# Patient Record
Sex: Female | Born: 1985 | Race: White | Hispanic: No | Marital: Married | Smoking: Never smoker
Health system: Southern US, Community
[De-identification: ages and names within clinical notes are randomized; demographics above are authoritative.]

---

## 2005-04-29 ENCOUNTER — Other Ambulatory Visit: Admission: RE | Admit: 2005-04-29 | Discharge: 2005-04-29 | Payer: Self-pay | Admitting: Obstetrics and Gynecology

## 2010-03-29 ENCOUNTER — Inpatient Hospital Stay (HOSPITAL_COMMUNITY): Admission: AD | Admit: 2010-03-29 | Payer: Self-pay | Admitting: Obstetrics and Gynecology

## 2010-03-30 ENCOUNTER — Inpatient Hospital Stay (HOSPITAL_COMMUNITY)
Admission: AD | Admit: 2010-03-30 | Discharge: 2010-03-30 | Disposition: A | Discharge status: Home / Self Care | Payer: 59 | Source: Ambulatory Visit | Attending: Obstetrics and Gynecology | Admitting: Obstetrics and Gynecology

## 2010-03-30 DIAGNOSIS — O479 False labor, unspecified: Secondary | ICD-10-CM | POA: Insufficient documentation

## 2010-03-31 ENCOUNTER — Inpatient Hospital Stay (HOSPITAL_COMMUNITY)
Admission: AD | Admit: 2010-03-31 | Discharge: 2010-04-02 | DRG: 775 | Disposition: A | Discharge status: Home / Self Care | Payer: 59 | Source: Ambulatory Visit | Attending: Obstetrics and Gynecology | Admitting: Obstetrics and Gynecology

## 2010-03-31 ENCOUNTER — Inpatient Hospital Stay (HOSPITAL_COMMUNITY)
Admission: AD | Admit: 2010-03-31 | Discharge: 2010-03-31 | Disposition: A | Discharge status: Home / Self Care | Payer: 59 | Source: Ambulatory Visit | Attending: Obstetrics and Gynecology | Admitting: Obstetrics and Gynecology

## 2010-03-31 DIAGNOSIS — O479 False labor, unspecified: Secondary | ICD-10-CM | POA: Insufficient documentation

## 2010-03-31 LAB — CBC
HCT: 36 % (ref 36.0–46.0)
MCV: 92.3 fL (ref 78.0–100.0)
RBC: 3.9 MIL/uL (ref 3.87–5.11)
WBC: 12.4 10*3/uL — ABNORMAL HIGH (ref 4.0–10.5)

## 2010-04-01 LAB — CBC
Hemoglobin: 8.8 g/dL — ABNORMAL LOW (ref 12.0–15.0)
MCH: 30.8 pg (ref 26.0–34.0)
MCV: 92.7 fL (ref 78.0–100.0)
RBC: 2.86 MIL/uL — ABNORMAL LOW (ref 3.87–5.11)
WBC: 12.3 10*3/uL — ABNORMAL HIGH (ref 4.0–10.5)

## 2014-12-01 LAB — OB RESULTS CONSOLE ABO/RH: RH TYPE: POSITIVE

## 2014-12-01 LAB — OB RESULTS CONSOLE HEPATITIS B SURFACE ANTIGEN: Hepatitis B Surface Ag: NEGATIVE

## 2014-12-01 LAB — OB RESULTS CONSOLE RUBELLA ANTIBODY, IGM: RUBELLA: IMMUNE

## 2014-12-01 LAB — OB RESULTS CONSOLE HIV ANTIBODY (ROUTINE TESTING): HIV: NONREACTIVE

## 2015-06-25 ENCOUNTER — Inpatient Hospital Stay (HOSPITAL_COMMUNITY): Payer: No Typology Code available for payment source

## 2015-06-25 ENCOUNTER — Encounter (HOSPITAL_COMMUNITY): Payer: Self-pay

## 2015-06-25 ENCOUNTER — Inpatient Hospital Stay (HOSPITAL_COMMUNITY): Payer: No Typology Code available for payment source | Admitting: Anesthesiology

## 2015-06-25 ENCOUNTER — Inpatient Hospital Stay (HOSPITAL_COMMUNITY)
Admission: AD | Admit: 2015-06-25 | Discharge: 2015-06-28 | DRG: 765 | Disposition: A | Discharge status: Home / Self Care | Payer: No Typology Code available for payment source | Source: Ambulatory Visit | Attending: Obstetrics and Gynecology | Admitting: Obstetrics and Gynecology

## 2015-06-25 ENCOUNTER — Encounter (HOSPITAL_COMMUNITY)
Admission: AD | Disposition: A | Discharge status: Home / Self Care | Payer: Self-pay | Source: Ambulatory Visit | Attending: Obstetrics and Gynecology

## 2015-06-25 DIAGNOSIS — O4693 Antepartum hemorrhage, unspecified, third trimester: Secondary | ICD-10-CM

## 2015-06-25 DIAGNOSIS — Z3A39 39 weeks gestation of pregnancy: Secondary | ICD-10-CM | POA: Diagnosis not present

## 2015-06-25 DIAGNOSIS — O4593 Premature separation of placenta, unspecified, third trimester: Principal | ICD-10-CM | POA: Diagnosis present

## 2015-06-25 DIAGNOSIS — Z98891 History of uterine scar from previous surgery: Secondary | ICD-10-CM

## 2015-06-25 DIAGNOSIS — O9903 Anemia complicating the puerperium: Secondary | ICD-10-CM | POA: Diagnosis not present

## 2015-06-25 DIAGNOSIS — D62 Acute posthemorrhagic anemia: Secondary | ICD-10-CM | POA: Diagnosis not present

## 2015-06-25 LAB — CBC
HCT: 30.4 % — ABNORMAL LOW (ref 36.0–46.0)
HCT: 22.9 % — ABNORMAL LOW (ref 36.0–46.0)
HEMOGLOBIN: 7.7 g/dL — AB (ref 12.0–15.0)
Hemoglobin: 10.1 g/dL — ABNORMAL LOW (ref 12.0–15.0)
MCH: 29.5 pg (ref 26.0–34.0)
MCH: 29.8 pg (ref 26.0–34.0)
MCHC: 33.2 g/dL (ref 30.0–36.0)
MCHC: 33.6 g/dL (ref 30.0–36.0)
MCV: 88.9 fL (ref 78.0–100.0)
MCV: 88.8 fL (ref 78.0–100.0)
PLATELETS: 260 10*3/uL (ref 150–400)
PLATELETS: 208 10*3/uL (ref 150–400)
RBC: 3.42 MIL/uL — AB (ref 3.87–5.11)
RBC: 2.58 MIL/uL — AB (ref 3.87–5.11)
RDW: 13.7 % (ref 11.5–15.5)
RDW: 13.5 % (ref 11.5–15.5)
WBC: 8 10*3/uL (ref 4.0–10.5)
WBC: 12.6 10*3/uL — AB (ref 4.0–10.5)

## 2015-06-25 LAB — PREPARE RBC (CROSSMATCH)

## 2015-06-25 LAB — RPR: RPR: NONREACTIVE

## 2015-06-25 LAB — ABO/RH: ABO/RH(D): A POS

## 2015-06-25 SURGERY — Surgical Case
Anesthesia: Spinal

## 2015-06-25 MED ORDER — NALBUPHINE HCL 10 MG/ML IJ SOLN: 5.0000 mg | Freq: Once | INTRAMUSCULAR | Status: DC | PRN

## 2015-06-25 MED ORDER — DIPHENHYDRAMINE HCL 50 MG/ML IJ SOLN: 12.5000 mg | INTRAMUSCULAR | Status: DC | PRN

## 2015-06-25 MED ORDER — SODIUM CHLORIDE 0.9% FLUSH: 3.0000 mL | INTRAVENOUS | Status: DC | PRN

## 2015-06-25 MED ORDER — PHENYLEPHRINE 8 MG IN D5W 100 ML (0.08MG/ML) PREMIX OPTIME
INJECTION | INTRAVENOUS | Status: DC | PRN
  Administered 2015-06-25: 80 ug/min via INTRAVENOUS

## 2015-06-25 MED ORDER — OXYTOCIN 10 UNIT/ML IJ SOLN
INTRAMUSCULAR | Status: AC
  Filled 2015-06-25: qty 4

## 2015-06-25 MED ORDER — FENTANYL CITRATE (PF) 100 MCG/2ML IJ SOLN: 25.0000 ug | INTRAMUSCULAR | Status: DC | PRN

## 2015-06-25 MED ORDER — ONDANSETRON HCL 4 MG/2ML IJ SOLN
INTRAMUSCULAR | Status: AC
  Filled 2015-06-25: qty 2

## 2015-06-25 MED ORDER — ZOLPIDEM TARTRATE 5 MG PO TABS: 5.0000 mg | ORAL_TABLET | Freq: Every evening | ORAL | Status: DC | PRN

## 2015-06-25 MED ORDER — CITRIC ACID-SODIUM CITRATE 334-500 MG/5ML PO SOLN
30.0000 mL | Freq: Once | ORAL | Status: AC
  Administered 2015-06-25: 30 mL via ORAL
  Filled 2015-06-25: qty 15

## 2015-06-25 MED ORDER — ACETAMINOPHEN 325 MG PO TABS
650.0000 mg | ORAL_TABLET | ORAL | Status: DC | PRN
  Administered 2015-06-26 – 2015-06-27 (×4): 650 mg via ORAL
  Filled 2015-06-25 (×5): qty 2

## 2015-06-25 MED ORDER — ONDANSETRON HCL 4 MG/2ML IJ SOLN: 4.0000 mg | Freq: Three times a day (TID) | INTRAMUSCULAR | Status: DC | PRN

## 2015-06-25 MED ORDER — FENTANYL CITRATE (PF) 100 MCG/2ML IJ SOLN
INTRAMUSCULAR | Status: DC | PRN
  Administered 2015-06-25: 12.5 ug via INTRATHECAL

## 2015-06-25 MED ORDER — MORPHINE SULFATE (PF) 0.5 MG/ML IJ SOLN
INTRAMUSCULAR | Status: DC | PRN
  Administered 2015-06-25: .1 mg via INTRATHECAL

## 2015-06-25 MED ORDER — COCONUT OIL OIL
1.0000 "application " | TOPICAL_OIL | Status: DC | PRN
  Administered 2015-06-27: 1 via TOPICAL
  Filled 2015-06-25: qty 120

## 2015-06-25 MED ORDER — NALBUPHINE HCL 10 MG/ML IJ SOLN: 5.0000 mg | INTRAMUSCULAR | Status: DC | PRN

## 2015-06-25 MED ORDER — LACTATED RINGERS IV SOLN
INTRAVENOUS | Status: DC
  Administered 2015-06-25 – 2015-06-26 (×3): via INTRAVENOUS

## 2015-06-25 MED ORDER — MEPERIDINE HCL 25 MG/ML IJ SOLN
INTRAMUSCULAR | Status: DC | PRN
  Administered 2015-06-25 (×2): 12.5 mg via INTRAVENOUS

## 2015-06-25 MED ORDER — FENTANYL CITRATE (PF) 100 MCG/2ML IJ SOLN
INTRAMUSCULAR | Status: AC
  Filled 2015-06-25: qty 2

## 2015-06-25 MED ORDER — SCOPOLAMINE 1 MG/3DAYS TD PT72
MEDICATED_PATCH | TRANSDERMAL | Status: AC
  Filled 2015-06-25: qty 1

## 2015-06-25 MED ORDER — WITCH HAZEL-GLYCERIN EX PADS: 1.0000 "application " | MEDICATED_PAD | CUTANEOUS | Status: DC | PRN

## 2015-06-25 MED ORDER — FAMOTIDINE IN NACL 20-0.9 MG/50ML-% IV SOLN
20.0000 mg | Freq: Once | INTRAVENOUS | Status: AC
  Administered 2015-06-25: 20 mg via INTRAVENOUS
  Filled 2015-06-25: qty 50

## 2015-06-25 MED ORDER — SENNOSIDES-DOCUSATE SODIUM 8.6-50 MG PO TABS
2.0000 | ORAL_TABLET | ORAL | Status: DC
  Administered 2015-06-26 – 2015-06-28 (×3): 2 via ORAL
  Filled 2015-06-25 (×3): qty 2

## 2015-06-25 MED ORDER — TETANUS-DIPHTH-ACELL PERTUSSIS 5-2.5-18.5 LF-MCG/0.5 IM SUSP: 0.5000 mL | Freq: Once | INTRAMUSCULAR | Status: DC

## 2015-06-25 MED ORDER — METOCLOPRAMIDE HCL 5 MG/ML IJ SOLN: 10.0000 mg | Freq: Once | INTRAMUSCULAR | Status: DC | PRN

## 2015-06-25 MED ORDER — DEXAMETHASONE SODIUM PHOSPHATE 4 MG/ML IJ SOLN
INTRAMUSCULAR | Status: AC
  Filled 2015-06-25: qty 1

## 2015-06-25 MED ORDER — PHENYLEPHRINE 40 MCG/ML (10ML) SYRINGE FOR IV PUSH (FOR BLOOD PRESSURE SUPPORT)
PREFILLED_SYRINGE | INTRAVENOUS | Status: AC
  Filled 2015-06-25: qty 10

## 2015-06-25 MED ORDER — SODIUM CHLORIDE 0.9 % IV SOLN
Freq: Once | INTRAVENOUS | Status: DC
Start: 2015-06-25 — End: 2015-06-25

## 2015-06-25 MED ORDER — KETOROLAC TROMETHAMINE 30 MG/ML IJ SOLN
INTRAMUSCULAR | Status: AC
  Filled 2015-06-25: qty 1

## 2015-06-25 MED ORDER — SODIUM CHLORIDE 0.9 % IV SOLN
10000.0000 ug | INTRAVENOUS | Status: DC | PRN
  Administered 2015-06-25 (×2): 80 ug via INTRAVENOUS

## 2015-06-25 MED ORDER — LACTATED RINGERS IV SOLN
INTRAVENOUS | Status: DC | PRN
  Administered 2015-06-25: 06:00:00 via INTRAVENOUS

## 2015-06-25 MED ORDER — OXYTOCIN 10 UNIT/ML IJ SOLN
40.0000 [IU] | INTRAVENOUS | Status: DC | PRN
  Administered 2015-06-25: 40 [IU] via INTRAVENOUS

## 2015-06-25 MED ORDER — SIMETHICONE 80 MG PO CHEW: 80.0000 mg | CHEWABLE_TABLET | ORAL | Status: DC | PRN

## 2015-06-25 MED ORDER — OXYCODONE HCL 5 MG PO TABS
10.0000 mg | ORAL_TABLET | ORAL | Status: DC | PRN
  Administered 2015-06-26 (×2): 10 mg via ORAL
  Filled 2015-06-25 (×2): qty 2

## 2015-06-25 MED ORDER — MEPERIDINE HCL 25 MG/ML IJ SOLN
INTRAMUSCULAR | Status: AC
  Filled 2015-06-25: qty 1

## 2015-06-25 MED ORDER — NALOXONE HCL 0.4 MG/ML IJ SOLN: 0.4000 mg | INTRAMUSCULAR | Status: DC | PRN

## 2015-06-25 MED ORDER — MENTHOL 3 MG MT LOZG: 1.0000 | LOZENGE | OROMUCOSAL | Status: DC | PRN

## 2015-06-25 MED ORDER — SCOPOLAMINE 1 MG/3DAYS TD PT72
1.0000 | MEDICATED_PATCH | Freq: Once | TRANSDERMAL | Status: DC
  Filled 2015-06-25: qty 1

## 2015-06-25 MED ORDER — OXYTOCIN 40 UNITS IN LACTATED RINGERS INFUSION - SIMPLE MED: 2.5000 [IU]/h | INTRAVENOUS | Status: AC

## 2015-06-25 MED ORDER — SCOPOLAMINE 1 MG/3DAYS TD PT72
MEDICATED_PATCH | TRANSDERMAL | Status: DC | PRN
  Administered 2015-06-25: 1 via TRANSDERMAL

## 2015-06-25 MED ORDER — SIMETHICONE 80 MG PO CHEW
80.0000 mg | CHEWABLE_TABLET | ORAL | Status: DC
  Administered 2015-06-26 – 2015-06-28 (×3): 80 mg via ORAL
  Filled 2015-06-25 (×3): qty 1

## 2015-06-25 MED ORDER — EPHEDRINE SULFATE 50 MG/ML IJ SOLN
INTRAMUSCULAR | Status: DC | PRN
  Administered 2015-06-25: 5 mg via INTRAVENOUS

## 2015-06-25 MED ORDER — MEPERIDINE HCL 25 MG/ML IJ SOLN: 6.2500 mg | INTRAMUSCULAR | Status: DC | PRN

## 2015-06-25 MED ORDER — PHENYLEPHRINE 8 MG IN D5W 100 ML (0.08MG/ML) PREMIX OPTIME
INJECTION | INTRAVENOUS | Status: AC
  Filled 2015-06-25: qty 100

## 2015-06-25 MED ORDER — IBUPROFEN 600 MG PO TABS
600.0000 mg | ORAL_TABLET | Freq: Four times a day (QID) | ORAL | Status: DC
  Administered 2015-06-26 – 2015-06-28 (×11): 600 mg via ORAL
  Filled 2015-06-25 (×11): qty 1

## 2015-06-25 MED ORDER — KETOROLAC TROMETHAMINE 30 MG/ML IJ SOLN
30.0000 mg | Freq: Four times a day (QID) | INTRAMUSCULAR | Status: AC | PRN
  Administered 2015-06-25: 30 mg via INTRAMUSCULAR

## 2015-06-25 MED ORDER — DEXAMETHASONE SODIUM PHOSPHATE 4 MG/ML IJ SOLN
INTRAMUSCULAR | Status: DC | PRN
  Administered 2015-06-25: 4 mg via INTRAVENOUS

## 2015-06-25 MED ORDER — LACTATED RINGERS IV SOLN: INTRAVENOUS | Status: DC

## 2015-06-25 MED ORDER — ONDANSETRON HCL 4 MG/2ML IJ SOLN
INTRAMUSCULAR | Status: DC | PRN
  Administered 2015-06-25: 4 mg via INTRAVENOUS

## 2015-06-25 MED ORDER — CEFAZOLIN SODIUM-DEXTROSE 2-4 GM/100ML-% IV SOLN
2.0000 g | INTRAVENOUS | Status: AC
  Administered 2015-06-25: 2 g via INTRAVENOUS
  Filled 2015-06-25: qty 100

## 2015-06-25 MED ORDER — DIPHENHYDRAMINE HCL 25 MG PO CAPS
25.0000 mg | ORAL_CAPSULE | ORAL | Status: DC | PRN
  Administered 2015-06-25: 25 mg via ORAL
  Filled 2015-06-25: qty 1

## 2015-06-25 MED ORDER — SIMETHICONE 80 MG PO CHEW
80.0000 mg | CHEWABLE_TABLET | Freq: Three times a day (TID) | ORAL | Status: DC
  Administered 2015-06-25 – 2015-06-28 (×10): 80 mg via ORAL
  Filled 2015-06-25 (×10): qty 1

## 2015-06-25 MED ORDER — NALOXONE HCL 2 MG/2ML IJ SOSY: 1.0000 ug/kg/h | PREFILLED_SYRINGE | INTRAVENOUS | Status: DC | PRN

## 2015-06-25 MED ORDER — BUPIVACAINE IN DEXTROSE 0.75-8.25 % IT SOLN
INTRATHECAL | Status: DC | PRN
  Administered 2015-06-25: 1.4 mL via INTRATHECAL

## 2015-06-25 MED ORDER — OXYCODONE HCL 5 MG PO TABS
5.0000 mg | ORAL_TABLET | ORAL | Status: DC | PRN
  Administered 2015-06-27 – 2015-06-28 (×2): 5 mg via ORAL
  Filled 2015-06-25 (×2): qty 1

## 2015-06-25 MED ORDER — SODIUM CHLORIDE 0.9 % IV SOLN
INTRAVENOUS | Status: DC
  Administered 2015-06-25: 05:00:00 via INTRAVENOUS

## 2015-06-25 MED ORDER — KETOROLAC TROMETHAMINE 30 MG/ML IJ SOLN
30.0000 mg | Freq: Four times a day (QID) | INTRAMUSCULAR | Status: AC | PRN
  Administered 2015-06-25: 30 mg via INTRAVENOUS
  Filled 2015-06-25: qty 1

## 2015-06-25 MED ORDER — DIBUCAINE 1 % RE OINT: 1.0000 "application " | TOPICAL_OINTMENT | RECTAL | Status: DC | PRN

## 2015-06-25 MED ORDER — LACTATED RINGERS IV SOLN
INTRAVENOUS | Status: DC | PRN
  Administered 2015-06-25 (×2): via INTRAVENOUS

## 2015-06-25 MED ORDER — MORPHINE SULFATE (PF) 0.5 MG/ML IJ SOLN
INTRAMUSCULAR | Status: AC
  Filled 2015-06-25: qty 10

## 2015-06-25 MED ORDER — DIPHENHYDRAMINE HCL 25 MG PO CAPS: 25.0000 mg | ORAL_CAPSULE | Freq: Four times a day (QID) | ORAL | Status: DC | PRN

## 2015-06-25 MED ORDER — PRENATAL MULTIVITAMIN CH
1.0000 | ORAL_TABLET | Freq: Every day | ORAL | Status: DC
  Administered 2015-06-26 – 2015-06-28 (×3): 1 via ORAL
  Filled 2015-06-25 (×3): qty 1

## 2015-06-25 SURGICAL SUPPLY — 36 items
APL SKNCLS STERI-STRIP NONHPOA (GAUZE/BANDAGES/DRESSINGS)
BENZOIN TINCTURE PRP APPL 2/3 (GAUZE/BANDAGES/DRESSINGS) IMPLANT
CLAMP CORD UMBIL (MISCELLANEOUS) IMPLANT
CLOSURE WOUND 1/2 X4 (GAUZE/BANDAGES/DRESSINGS)
CLOTH BEACON ORANGE TIMEOUT ST (SAFETY) ×3 IMPLANT
CONTAINER PREFILL 10% NBF 15ML (MISCELLANEOUS) IMPLANT
DRSG OPSITE POSTOP 4X10 (GAUZE/BANDAGES/DRESSINGS) ×3 IMPLANT
DURAPREP 26ML APPLICATOR (WOUND CARE) ×3 IMPLANT
ELECT REM PT RETURN 9FT ADLT (ELECTROSURGICAL) ×3
ELECTRODE REM PT RTRN 9FT ADLT (ELECTROSURGICAL) ×1 IMPLANT
EXTRACTOR VACUUM M CUP 4 TUBE (SUCTIONS) IMPLANT
EXTRACTOR VACUUM M CUP 4' TUBE (SUCTIONS)
GLOVE BIO SURGEON STRL SZ8 (GLOVE) ×3 IMPLANT
GLOVE BIOGEL PI IND STRL 7.0 (GLOVE) ×1 IMPLANT
GLOVE BIOGEL PI INDICATOR 7.0 (GLOVE) ×2
GOWN STRL REUS W/TWL LRG LVL3 (GOWN DISPOSABLE) ×6 IMPLANT
HEMOSTAT SURGICEL 2X3 (HEMOSTASIS) ×2 IMPLANT
KIT ABG SYR 3ML LUER SLIP (SYRINGE) ×3 IMPLANT
LIQUID BAND (GAUZE/BANDAGES/DRESSINGS) IMPLANT
NDL HYPO 25X5/8 SAFETYGLIDE (NEEDLE) ×1 IMPLANT
NEEDLE HYPO 25X5/8 SAFETYGLIDE (NEEDLE) ×3 IMPLANT
NS IRRIG 1000ML POUR BTL (IV SOLUTION) ×3 IMPLANT
PACK C SECTION WH (CUSTOM PROCEDURE TRAY) ×3 IMPLANT
PAD OB MATERNITY 4.3X12.25 (PERSONAL CARE ITEMS) ×3 IMPLANT
PENCIL SMOKE EVAC W/HOLSTER (ELECTROSURGICAL) ×3 IMPLANT
STRIP CLOSURE SKIN 1/2X4 (GAUZE/BANDAGES/DRESSINGS) IMPLANT
SUT MNCRL 0 VIOLET CTX 36 (SUTURE) ×4 IMPLANT
SUT MONOCRYL 0 CTX 36 (SUTURE) ×8
SUT PDS AB 0 CTX 60 (SUTURE) ×3 IMPLANT
SUT PLAIN 0 NONE (SUTURE) ×4 IMPLANT
SUT PLAIN 2 0 (SUTURE)
SUT PLAIN 2 0 XLH (SUTURE) IMPLANT
SUT PLAIN ABS 2-0 CT1 27XMFL (SUTURE) IMPLANT
SUT VIC AB 4-0 KS 27 (SUTURE) ×3 IMPLANT
TOWEL OR 17X24 6PK STRL BLUE (TOWEL DISPOSABLE) ×3 IMPLANT
TRAY FOLEY CATH SILVER 14FR (SET/KITS/TRAYS/PACK) ×3 IMPLANT

## 2015-06-25 NOTE — Anesthesia Preprocedure Evaluation (Signed)
Anesthesia Evaluation  Patient identified by MRN, date of birth, ID band Patient awake    Reviewed: Allergy & Precautions, NPO status , Patient's Chart, lab work & pertinent test results  Airway Mallampati: II  TM Distance: >3 FB Neck ROM: Full    Dental no notable dental hx.    Pulmonary neg pulmonary ROS,    Pulmonary exam normal breath sounds clear to auscultation       Cardiovascular negative cardio ROS Normal cardiovascular exam Rhythm:Regular Rate:Normal     Neuro/Psych negative neurological ROS  negative psych ROS   GI/Hepatic negative GI ROS, Neg liver ROS,   Endo/Other  negative endocrine ROS  Renal/GU negative Renal ROS  negative genitourinary   Musculoskeletal negative musculoskeletal ROS (+)   Abdominal   Peds negative pediatric ROS (+)  Hematology negative hematology ROS (+)   Anesthesia Other Findings   Reproductive/Obstetrics (+) Pregnancy Suspected abruption                             Anesthesia Physical Anesthesia Plan  ASA: II  Anesthesia Plan: Spinal   Post-op Pain Management:    Induction:   Airway Management Planned: Natural Airway  Additional Equipment:   Intra-op Plan:   Post-operative Plan:   Informed Consent: I have reviewed the patients History and Physical, chart, labs and discussed the procedure including the risks, benefits and alternatives for the proposed anesthesia with the patient or authorized representative who has indicated his/her understanding and acceptance.   Dental advisory given  Plan Discussed with: CRNA  Anesthesia Plan Comments:         Anesthesia Quick Evaluation

## 2015-06-25 NOTE — Anesthesia Procedure Notes (Signed)
Spinal Patient location during procedure: OR Staffing Anesthesiologist: Orlo Brickle Performed by: anesthesiologist  Preanesthetic Checklist Completed: patient identified, site marked, surgical consent, pre-op evaluation, timeout performed, IV checked, risks and benefits discussed and monitors and equipment checked Spinal Block Patient position: sitting Prep: ChloraPrep Patient monitoring: heart rate, continuous pulse ox and blood pressure Approach: midline Location: L3-4 Injection technique: single-shot Needle Needle type: Sprotte  Needle gauge: 24 G Needle length: 9 cm Additional Notes Expiration date of kit checked and confirmed. Patient tolerated procedure well, without complications.     

## 2015-06-25 NOTE — Anesthesia Postprocedure Evaluation (Signed)
Anesthesia Post Note  Patient: Ashley SinnerLeslie G Wolf  Procedure(s) Performed: Procedure(s) (LRB): CESAREAN SECTION (N/A)  Patient location during evaluation: PACU Anesthesia Type: Spinal Level of consciousness: oriented and awake and alert Pain management: pain level controlled Vital Signs Assessment: post-procedure vital signs reviewed and stable Respiratory status: spontaneous breathing, respiratory function stable and patient connected to nasal cannula oxygen Cardiovascular status: blood pressure returned to baseline and stable Postop Assessment: no headache, no backache, spinal receding and patient able to bend at knees Anesthetic complications: no     Last Vitals:  Filed Vitals:   06/25/15 0730 06/25/15 0745  BP: 108/78 113/77  Pulse: 71 80  Temp: 36.3 C 36.4 C  Resp: 16 16    Last Pain:  Filed Vitals:   06/25/15 0750  PainSc: 3    Pain Goal:    LLE Motor Response: Purposeful movement (06/25/15 0745) LLE Sensation: Decreased (06/25/15 0745) RLE Motor Response: Purposeful movement (06/25/15 0745) RLE Sensation: Decreased (06/25/15 0745)      Myriam Brandhorst J

## 2015-06-25 NOTE — Progress Notes (Signed)
Bair Hugger applied

## 2015-06-25 NOTE — Brief Op Note (Signed)
06/25/2015  6:55 AM  PATIENT:  Martina SinnerLeslie G Lerette  30 y.o. female  PRE-OPERATIVE DIAGNOSIS:  cesarean section vaginal bleeding, probable placental abpruption  POST-OPERATIVE DIAGNOSIS:  cesarean section, vaginal bleeding, placental abpurtion  PROCEDURE:  Procedure(s): CESAREAN SECTION (N/A)  SURGEON:  Surgeon(s) and Role:    * Harold HedgeJames Aryeh Butterfield, MD - Primary  PHYSICIAN ASSISTANT:   ASSISTANTS: none   ANESTHESIA:   spinal  EBL:  Total I/O In: 2500 [I.V.:2500] Out: 1200 [Urine:400; Blood:800]  BLOOD ADMINISTERED:none  DRAINS: Urinary Catheter (Foley)   LOCAL MEDICATIONS USED:  NONE  SPECIMEN:  Source of Specimen:  placenta  DISPOSITION OF SPECIMEN:  PATHOLOGY  COUNTS:  YES  TOURNIQUET:  * No tourniquets in log *  DICTATION: .Other Dictation: Dictation Number [id number]  PLAN OF CARE: Admit to inpatient   PATIENT DISPOSITION:  PACU - hemodynamically stable.   Delay start of Pharmacological VTE agent (>24hrs) due to surgical blood loss or risk of bleeding: not applicable

## 2015-06-25 NOTE — Transfer of Care (Signed)
Immediate Anesthesia Transfer of Care Note  Patient: Ashley Wolf  Procedure(s) Performed: Procedure(s): CESAREAN SECTION (N/A)  Patient Location: PACU  Anesthesia Type:Spinal  Level of Consciousness: awake, alert , oriented and patient cooperative  Airway & Oxygen Therapy: Patient Spontanous Breathing  Post-op Assessment: Report given to RN and Post -op Vital signs reviewed and stable  Post vital signs: Reviewed and stable  Last Vitals: There were no vitals filed for this visit.  Last Pain:  Filed Vitals:   06/25/15 0629  PainSc: 4          Complications: No apparent anesthesia complications

## 2015-06-25 NOTE — H&P (Signed)
Ashley SinnerLeslie G Wolf is a 30 y.o. female presenting for Vaginal bleeding since 0330 . Maternal Medical History:  Reason for admission: Vaginal bleeding.  Nausea. Started bleeding 0330  Contractions: Onset was 1-2 hours ago.   Frequency: regular.   Perceived severity is moderate.    Fetal activity: Perceived fetal activity is normal.   Last perceived fetal movement was within the past hour.    Prenatal complications: Bleeding.   No HIV, PIH, oligohydramnios, placental abnormality, polyhydramnios, pre-eclampsia or preterm labor.   Prenatal Complications - Diabetes: none.    OB History    Gravida Para Term Preterm AB TAB SAB Ectopic Multiple Living   2 1 1       1      History reviewed. No pertinent past medical history. History reviewed. No pertinent past surgical history. Family History: family history is not on file. Social History:  reports that she has never smoked. She does not have any smokeless tobacco history on file. Her alcohol and drug histories are not on file.   Prenatal Transfer Tool  Maternal Diabetes: No Genetic Screening: Normal Maternal Ultrasounds/Referrals: Normal Fetal Ultrasounds or other Referrals:  None Maternal Substance Abuse:  No Significant Maternal Medications:  None Significant Maternal Lab Results:  None Other Comments:  None  Review of Systems  Constitutional: Negative for fever, chills and malaise/fatigue.  Respiratory: Negative for shortness of breath.   Gastrointestinal: Positive for abdominal pain. Negative for nausea, diarrhea and constipation.  Genitourinary: Negative for dysuria.       Vaginal bleeding  Musculoskeletal: Negative for myalgias.  Neurological: Positive for dizziness. Negative for headaches.    Dilation: 2.5 Effacement (%): 80 Station: -2 Exam by:: [name] CNM There were no vitals taken for this visit. Maternal Exam:  Uterine Assessment: Contraction strength is moderate.  Contraction frequency is regular.    Abdomen: Patient reports no abdominal tenderness. Fundal height is 39.   Estimated fetal weight is 7.   Fetal presentation: vertex  Introitus: Normal vulva. Vagina is positive for vaginal discharge (blood with clots).  Ferning test: negative.  Nitrazine test: not done. Amniotic fluid character: bloody.  Pelvis: adequate for delivery.   Cervix: Cervix evaluated by digital exam.     Fetal Exam Fetal Monitor Review: Mode: ultrasound.   Baseline rate: 130.  Variability: moderate (6-25 bpm).   Pattern: accelerations present.    Fetal State Assessment: Category I - tracings are normal.     Physical Exam  Constitutional: She is oriented to person, place, and time. She appears well-developed and well-nourished. No distress.  HENT:  Head: Normocephalic.  Cardiovascular: Normal rate, regular rhythm and normal heart sounds.   Respiratory: Effort normal and breath sounds normal.  GI: Soft. She exhibits no distension. There is no tenderness. There is no rebound and no guarding.  Genitourinary: Vaginal discharge (blood with clots) found.  Dilation: 2.5 Effacement (%): 80 Station: -2 Exam by:: [name] CNM   Musculoskeletal: Normal range of motion.  Neurological: She is alert and oriented to person, place, and time.  Skin: Skin is warm and dry.  Psychiatric: She has a normal mood and affect.    Prenatal labs: ABO, Rh: --/--/A POS (05/14 0430) Antibody: PENDING (05/14 0430) Rubella:   RPR:    HBsAg:    HIV:    GBS:     Assessment/Plan: SIUP at 3655w4d  Vaginal bleeding in third trimester, probably abruption  Admit to OR Planned urgent Cesarean Delivery per DR Jose Persiaomblin   [name],[name] 06/25/2015, 5:05  AM     

## 2015-06-25 NOTE — Lactation Note (Signed)
This note was copied from a baby's chart. Lactation Consultation Note Initial visit at 14 hours of age.  Mom reports several good feedings.  Mom reports nipple pain with breastfeeding her now [age] year old and low milk supply.  Mom does not report much in breast changes this pregnancy, but appear to have breasts WNL.  Discussed supply and demand and milk volume increasing.  LC assisted with skin to skin with pillow support.  Mom latched with minimal assist in football hold on left breast. Baby latched well with audible swallows and strong rhythmic sucking.  FOB at bedside supportive.  [facility] resources given and discussed.  Encouraged to feed with early cues on demand.  Early newborn behavior discussed.  Hand expression demonstrated by mom with colostrum visible.  Mom to call for assist as needed.    Patient Name: Boy Fausto SkillernLeslie Husak ZOXWR'UToday's Date: 06/25/2015 Reason for consult: Initial assessment   Maternal Data Has patient been taught Hand Expression?: Yes Does the patient have breastfeeding experience prior to this delivery?: Yes  Feeding Feeding Type: Breast Fed Length of feed: 10 min  LATCH Score/Interventions Latch: Grasps breast easily, tongue down, lips flanged, rhythmical sucking.  Audible Swallowing: A few with stimulation  Type of Nipple: Everted at rest and after stimulation  Comfort (Breast/Nipple): Soft / non-tender     Hold (Positioning): No assistance needed to correctly position infant at breast. Intervention(s): Breastfeeding basics reviewed;Support Pillows;Position options;Skin to skin  LATCH Score: 9  Lactation Tools Discussed/Used     Consult Status Consult Status: Follow-up Date: 06/26/15 Follow-up type: In-patient    Jannifer RodneyShoptaw, Zenaida Tesar Lynn 06/25/2015, 8:26 PM

## 2015-06-25 NOTE — Op Note (Signed)
NAMFausto Skillern:  [place], [place]               ACCOUNT NO.:  [id number]  MEDICAL RECORD NO.:  [id number]  LOCATION:  [facility]                          FACILITY:  [facility]  PHYSICIAN:  Guy SandiferJames E. Henderson Cloudomblin, M.D. DATE OF BIRTH:  1985-07-25  DATE OF PROCEDURE:  06/25/2015 DATE OF DISCHARGE:                              OPERATIVE REPORT   PREOPERATIVE DIAGNOSES: 1. Intrauterine pregnancy at 39-4/7th weeks. 2. Probable placental abruption.  POSTOPERATIVE DIAGNOSES: 1. Intrauterine pregnancy at 39-4/7th weeks. 2. Placental abruption.  PROCEDURE:  Low-transverse cesarean section.  SURGEON:  Guy SandiferJames E. Henderson Cloudomblin, M.D.  ANESTHESIA:  Spinal, Phillips GroutPeter Carignan, MD.  ESTIMATED BLOOD LOSS:  800 mL.  SPECIMENS:  Placenta to Pathology.  FINDINGS:  Viable female infant.  Apgars, birth weight pending.  Arterial cord pH unable to be obtained due to insufficient blood.  INDICATIONS AND CONSENT:  This patient is a 30 year old married white female, G2, P1, at 39-4/7th weeks.  She presents to Maternity Admissions with complaints of vaginal bleeding.  Fetal heart tones were reactive. Uterus is soft.  She is contracting irregularly.  However, she is having vaginal bleeding enough to drip to the floor when she stands.  Cervix is approximately 2-3 cm dilated.  Ultrasound was obtained prior to cervical check and noted a posterior placenta and normal amniotic fluid with vertex position.  Diagnosis of probable abruption of placenta was made; and due to the anticipated length of labor, immediate cesarean section was recommended.  Potential risks and complications were discussed preoperatively, including but not limited to infection, organ damage, bleeding requiring transfusion of blood products with HIV and hepatitis acquisition, DVT, PE, pneumonia.  The patient states she understands and agrees.  All questions were answered, and consent is signed on the chart.  PROCEDURE IN DETAIL:  The patient was taken to the operating room  where she was identified and a spinal anesthetic was placed per Anesthesiology.  She was then placed in a dorsal supine position with a 15-degree left lateral wedge.  A Foley catheter was placed, and she was prepped and draped in a sterile fashion [facility] protocol. A time-out was undertaken.  After testing for adequate spinal anesthesia, skin was entered through a Pfannenstiel incision, and dissection was carried out in layers to the peritoneum which was incised, extended superiorly and inferiorly.  Vesicouterine peritoneum was taken down cephalad-laterally.  Bladder flap was developed.  A bladder blade was placed.  Uterus was incised in a low transverse manner, and the uterine cavity was entered bluntly with a hemostat.  The uterine incision was extended cephalad-laterally with fingers.  Vertex was then delivered without difficulty.  Good cry and tone was noted, and the baby was completely delivered.  Cord was clamped and cut.  The baby was handed to awaiting pediatrics team.  Placenta is delivered.  After delivery, on the inferior margin of the placenta, on the posterior uterine wall, blood clot could be noted consistent with abruption. Uterine cavity was clean.  Uterus was closed in two running locking imbricating layers of 0 Monocryl suture.  There was a bleeder at the left angle.  Therefore, the uterus was exteriorized for good visualization.  An O'Leary stitch was placed on  the left side.  This left a possible small superficial venous bleeder which was controlled with two additional 2-0 chromics.  The uterus was replaced and observation reveals good hemostasis.  Lavage was carried out.  The tubes and ovaries were normal as well.  Surgicel was placed over the left angle of the incision, and the anterior peritoneum was closed in running fashion with 0 Monocryl suture which was also used to reapproximate the pyramidalis muscle in midline.  Anterior rectus fascia was closed  in a running fashion with a 0 looped PDS suture.  Subcutaneous layer was closed with interrupted plain, and the skin was closed in a subcuticular fashion with Vicryl on a Keith needle.  Benzoin, Steri-Strips were applied.  All counts were correct.  The patient was taken to the recovery room in stable condition.     Guy Sandifer Henderson Cloud, M.D.     JET/MEDQ  D:  06/25/2015  T:  06/25/2015  Job:  [id number]

## 2015-06-25 NOTE — MAU Note (Signed)
Pt presents stating she thinks her water broke at 0330 and when it did "blood poured out." Reports decreased fetal movement and lots of contractions.

## 2015-06-25 NOTE — MAU Provider Note (Signed)
Chief Complaint:  Vaginal Bleeding and Rupture of Membranes   Provider saw patient at about 4:00am   HPI  Ashley Wolf is a 30 y.o. G2P1001 at 39w4dwho presents to maternity admissions reporting leaking ? Fluid and bleeding. Thought her water broke at 0330, but when she looked, all she saw was blood.   She reports decreased fetal movement, denies LOF, vaginal itching/burning, urinary symptoms, h/a, dizziness, n/v, diarrhea, constipation or fever/chills.  She denies headache, visual changes or RUQ abdominal pain.  After being her for a little while pt felt dizzy and became temporarily tachycardic, then resolved.  I turned her to her side and she felt better.   RN Note: Pt presents stating she thinks her water broke at 0330 and when it did "blood poured out." Reports decreased fetal movement and lots of contractions  Past Medical History: History reviewed. No pertinent past medical history.  Past obstetric history: OB History  Gravida Para Term Preterm AB SAB TAB Ectopic Multiple Living  # Outcome Date GA Lbr Len/2nd Weight Sex Delivery Anes PTL Lv  2 Current           1 Term      Vag-Spont   Y    No problems with first delivery (done by Dr Henderson Cloud)  Past Surgical History: History reviewed. No pertinent past surgical history.  Family History: History reviewed. No pertinent family history.  Social History: Social History  Substance Use Topics  . Smoking status: Never Smoker   . Smokeless tobacco: None  . Alcohol Use: None    Allergies: No Known Allergies  Meds:  No prescriptions prior to admission    I have reviewed patient's Past Medical Hx, Surgical Hx, Family Hx, Social Hx, medications and allergies.   ROS:  Review of Systems  Constitutional: Negative for fever and chills.  Respiratory: Negative for shortness of breath.   Gastrointestinal: Positive for abdominal pain. Negative for nausea, vomiting, diarrhea and constipation.  Genitourinary:  Positive for vaginal bleeding and vaginal discharge. Negative for dysuria and vaginal pain.  Musculoskeletal: Negative for back pain.  Neurological: Negative for dizziness and weakness.  Psychiatric/Behavioral: The patient is nervous/anxious.    Other systems negative  Physical Exam  No data found.  Constitutional: Well-developed, well-nourished female in no acute distress, but worried.  Cardiovascular: normal rate and rhythm, no ectopy Respiratory: normal effort, clear to auscultation bilaterally GI: Abd soft, non-tender, gravid appropriate for gestational age.   No rebound or guarding. MS: Extremities nontender, no edema, normal ROM Neurologic: Alert and oriented x 4.  GU: Neg CVAT.  PELVIC EXAM: Speculum exam, moderate blood in vault, no obvious pooling of water, but small pool of blood with passage of 4cm clot   Cervix 3/80/-3/vertex     FHT:  Baseline 130 , moderate variability, accelerations present, no decelerations Contractions: q 3 mins   Labs:  Imaging:  Posterior placenta via bedside US No obvious abruption AFI 11 cm  MAU Course/MDM: I have ordered labs and reviewed results.  NST reviewed Consult Dr Henderson Cloud with presentation, exam findings and test results.   He recommends urgent cesarean delivery IV already started with NS infusion Type and cross 2 units  Assessment: 1. Vaginal bleeding in pregnancy, third trimester     Plan: Admit to OR per Dr Henderson Cloud Cesarean delivery    Medication List    Notice    You have not been prescribed any medications.  Wynelle BourgeoisMarie [name] CNM, MSN Certified Nurse-Midwife 06/25/2015 4:32 AM

## 2015-06-25 NOTE — H&P (Signed)
Ashley Wolf is a 30 y.o. female presenting for Vaginal bleeding since 0330 . Maternal Medical History:  Reason for admission: Vaginal bleeding.  Nausea. Started bleeding 0330  Contractions: Onset was 1-2 hours ago.  Frequency: regular.  Perceived severity is moderate.   Fetal activity: Perceived fetal activity is normal.  Last perceived fetal movement was within the past hour.   Prenatal complications: Bleeding.  No HIV, PIH, oligohydramnios, placental abnormality, polyhydramnios, pre-eclampsia or preterm labor.  Prenatal Complications - Diabetes: none.   OB History    Gravida Para Term Preterm AB TAB SAB Ectopic Multiple Living   History reviewed. No pertinent past medical history. History reviewed. No pertinent past surgical history. Family History: family history is not on file. Social History:  reports that she has never smoked. She does not have any smokeless tobacco history on file. Her alcohol and drug histories are not on file.   Prenatal Transfer Tool  Maternal Diabetes: No Genetic Screening: Normal Maternal Ultrasounds/Referrals: Normal Fetal Ultrasounds or other Referrals: None Maternal Substance Abuse: No Significant Maternal Medications: None Significant Maternal Lab Results: None Other Comments: None  Review of Systems  Constitutional: Negative for fever, chills and malaise/fatigue.  Respiratory: Negative for shortness of breath.  Gastrointestinal: Positive for abdominal pain. Negative for nausea, diarrhea and constipation.  Genitourinary: Negative for dysuria.   Vaginal bleeding  Musculoskeletal: Negative for myalgias.  Neurological: Positive for dizziness. Negative for headaches.    Dilation: 2.5 Effacement (%): 80 Station: -2 Exam by:: Artelia Laroche CNM There were no vitals taken for this visit. Maternal Exam:  Uterine Assessment: Contraction strength is moderate. Contraction  frequency is regular.   Abdomen: Patient reports no abdominal tenderness. Fundal height is 39.  Estimated fetal weight is 7.  Fetal presentation: vertex  Introitus: Normal vulva. Vagina is positive for vaginal discharge (blood with clots).  Ferning test: negative.  Nitrazine test: not done. Amniotic fluid character: bloody.  Pelvis: adequate for delivery.  Cervix: Cervix evaluated by digital exam.   Fetal Exam Fetal Monitor Review: Mode: ultrasound.  Baseline rate: 130.  Variability: moderate (6-25 bpm).  Pattern: accelerations present.   Fetal State Assessment: Category I - tracings are normal.    Physical Exam  Constitutional: She is oriented to person, place, and time. She appears well-developed and well-nourished. No distress.  HENT:  Head: Normocephalic.  Cardiovascular: Normal rate, regular rhythm and normal heart sounds.  Respiratory: Effort normal and breath sounds normal.  GI: Soft. She exhibits no distension. There is no tenderness. There is no rebound and no guarding.  Genitourinary: Vaginal discharge (blood with clots) found.  Dilation: 2.5 Effacement (%): 80 Station: -2 Exam by:: Artelia Laroche CNM  Musculoskeletal: Normal range of motion.  Neurological: She is alert and oriented to person, place, and time.  Skin: Skin is warm and dry.  Psychiatric: She has a normal mood and affect.    Prenatal labs: ABO, Rh: --/--/A POS (05/14 0430) Antibody: PENDING (05/14 0430) Rubella:   RPR:    HBsAg:    HIV:    GBS:     Assessment/Plan: [facility]UP at [redacted]w[redacted]d  Vaginal bleeding in third trimester, probably abruption  Admit to OR Planned urgent Cesarean Delivery per DR Jose Persia 06/25/2015, 5:05 AM                D/W patient and husband probable placental abruption. Recommend cesarean section. Reviewed risks including infection, organ  damage, bleeding/transfusion-HIV/Hep, DVT/PE, pneumonia. Patient states she understands and  agrees.

## 2015-06-26 ENCOUNTER — Encounter (HOSPITAL_COMMUNITY): Payer: Self-pay

## 2015-06-26 LAB — CBC
HCT: 21.4 % — ABNORMAL LOW (ref 36.0–46.0)
HCT: 27 % — ABNORMAL LOW (ref 36.0–46.0)
HEMOGLOBIN: 7.2 g/dL — AB (ref 12.0–15.0)
Hemoglobin: 8.9 g/dL — ABNORMAL LOW (ref 12.0–15.0)
MCH: 30 pg (ref 26.0–34.0)
MCH: 29.2 pg (ref 26.0–34.0)
MCHC: 33.6 g/dL (ref 30.0–36.0)
MCHC: 33 g/dL (ref 30.0–36.0)
MCV: 89.2 fL (ref 78.0–100.0)
MCV: 88.5 fL (ref 78.0–100.0)
PLATELETS: 194 10*3/uL (ref 150–400)
Platelets: 230 10*3/uL (ref 150–400)
RBC: 2.4 MIL/uL — AB (ref 3.87–5.11)
RBC: 3.05 MIL/uL — ABNORMAL LOW (ref 3.87–5.11)
RDW: 13.8 % (ref 11.5–15.5)
RDW: 15 % (ref 11.5–15.5)
WBC: 10 10*3/uL (ref 4.0–10.5)
WBC: 10.1 10*3/uL (ref 4.0–10.5)

## 2015-06-26 MED ORDER — ACETAMINOPHEN 325 MG PO TABS
650.0000 mg | ORAL_TABLET | Freq: Once | ORAL | Status: AC
  Administered 2015-06-26: 650 mg via ORAL
  Filled 2015-06-26: qty 2

## 2015-06-26 MED ORDER — SODIUM CHLORIDE 0.9 % IV SOLN
Freq: Once | INTRAVENOUS | Status: AC
  Administered 2015-06-26: 14:00:00 via INTRAVENOUS

## 2015-06-26 MED ORDER — SODIUM CHLORIDE 0.9 % IV SOLN
INTRAVENOUS | Status: DC
  Administered 2015-06-26: 17:00:00 via INTRAVENOUS

## 2015-06-26 MED ORDER — DIPHENHYDRAMINE HCL 25 MG PO CAPS
25.0000 mg | ORAL_CAPSULE | Freq: Once | ORAL | Status: AC
  Administered 2015-06-26: 25 mg via ORAL
  Filled 2015-06-26: qty 1

## 2015-06-26 NOTE — Progress Notes (Signed)
Called Dr. Langston MaskerMorris to state that blood transfusion was complete. Also to inquire when a CBC needs to be drawn and whether to dc foley. Patient was  Dizzy during the day. The last time patient was up was 0200 AM. Dr. Langston MaskerMorris is currently delivering a patient and will call me back.

## 2015-06-26 NOTE — Progress Notes (Signed)
Subjective: Postpartum Day 1: Cesarean Delivery Patient reports incisional pain and tolerating PO.  Very light headed on standing.  Objective: Vital signs in last 24 hours: Temp:  [98 F (36.7 C)-98.7 F (37.1 C)] 98.1 F (36.7 C) (05/15 0945) Pulse Rate:  [60-82] 60 (05/15 0945) Resp:  [17-20] 18 (05/15 0945) BP: (91-113)/(50-70) 102/50 mmHg (05/15 0945) SpO2:  [97 %-99 %] 98 % (05/15 0945)  Physical Exam:  General: alert, cooperative and appears stated age Lochia: appropriate Uterine Fundus: firm Incision: healing well, no significant drainage, no dehiscence DVT Evaluation: No evidence of DVT seen on physical exam. Negative Homan's sign. No cords or calf tenderness.   Recent Labs  06/25/15 1441 06/26/15 0539  HGB 7.7* 7.2*  HCT 22.9* 21.4*    Assessment/Plan: Status post Cesarean section. Doing well postoperatively.  Continue current care. Acute blood loss on chronic anemia; symptomatic-will tx 2 u PRBCs.   Desires circ-counseled re: risk of bleeding, infection, and scarring.  Will proceed.  All questions were answered.  Ashley Wolf 06/26/2015, 11:10 AM

## 2015-06-26 NOTE — Progress Notes (Signed)
Dr Henderson Cloudomblin called with patients intolerance to activity. Ambulated patient to restroom and patient became dizzy and pale at 0215. Fundus remains firm at U and scant lochia without clots. Orders received to keep patient on bedrest and leave foley catheter in until evaluated this morning.

## 2015-06-26 NOTE — Lactation Note (Signed)
This note was copied from a baby's chart. Lactation Consultation Note  Mother denies problems or questions w/ breastfeeding. Hx chronic anemia and has had blood loss.  Encouraged fluids. Baby recently circumcised.  Encouraged STS. Reminded mother to wait for wide open mouth for increased depth to prevent future soreness. Suggest she call if needs assistance. Mom encouraged to feed baby 8-12 times/24 hours and with feeding cues.    Patient Name: Ashley Fausto SkillernLeslie Baquero RUEAV'WToday's Date: 06/26/2015 Reason for consult: Follow-up assessment   Maternal Data    Feeding Feeding Type: Breast Fed Length of feed: 25 min  LATCH Score/Interventions                      Lactation Tools Discussed/Used     Consult Status Consult Status: Follow-up Date: 06/27/15 Follow-up type: In-patient    Dahlia ByesBerkelhammer, Ruth [facility] 06/26/2015, 2:58 PM

## 2015-06-27 LAB — TYPE AND SCREEN
ABO/RH(D): A POS
ANTIBODY SCREEN: NEGATIVE
UNIT DIVISION: 0
Unit division: 0
Unit division: 0
Unit division: 0

## 2015-06-27 LAB — SYPHILIS: RPR W/REFLEX TO RPR TITER AND TREPONEMAL ANTIBODIES, TRADITIONAL SCREENING AND DIAGNOSIS ALGORITHM: RPR Ser Ql: NONREACTIVE

## 2015-06-27 NOTE — Lactation Note (Signed)
This note was copied from a baby's chart. Lactation Consultation Note Follow up visit at 65 hours of age.  RN gave mom comfort gels due to continued nipple pain with coconut oil use.  Mom to d/c coconut oil and use comfort gels.  Baby asleep and mom has comfort gels in place.  Nipples are pink and sore but appear intact.  LC gave mom hand pump with nipple more everted.  LC gave mom shells to alternate with comfort gels to help evert nipples for latching.  Mom reports initial latch on pain that improves as feeding continues.  Mom to call for assist as needed.  Report given to night Rn with plan to observe LATCH during the night.  LC to follow prior to discharge and offer o/p appointment.   Patient Name: Ashley Fausto SkillernLeslie Hincapie JWJXB'JToday's Date: 06/27/2015 Reason for consult: Follow-up assessment;Breast/nipple pain   Maternal Data    Feeding Feeding Type: Breast Fed  LATCH Score/Interventions Latch: Repeated attempts needed to sustain latch, nipple held in mouth throughout feeding, stimulation needed to elicit sucking reflex. Intervention(s): Assist with latch  Audible Swallowing: Spontaneous and intermittent  Type of Nipple: Flat (short shaft) Intervention(s): Shells;Hand pump  Comfort (Breast/Nipple): Filling, red/small blisters or bruises, mild/mod discomfort  Problem noted: Mild/Moderate discomfort Interventions (Mild/moderate discomfort): Comfort gels  Hold (Positioning): No assistance needed to correctly position infant at breast. Intervention(s): Support Pillows  LATCH Score: 8  Lactation Tools Discussed/Used Pump Review: Setup, frequency, and cleaning Initiated by:: JS Date initiated:: 06/27/15   Consult Status Consult Status: Follow-up Date: 06/28/15 Follow-up type: In-patient    Shoptaw, Arvella MerlesJana Lynn 06/27/2015, 11:30 PM

## 2015-06-27 NOTE — Progress Notes (Signed)
Patient feeling much better since blood transfusion.  BP 102/65 mmHg  Pulse 57  Temp(Src) 98 F (36.7 C) (Oral)  Resp 18  SpO2 99%  Breastfeeding? Unknown Results for orders placed or performed during the hospital encounter of 06/25/15 (from the past 24 hour(s))  CBC     Status: Abnormal   Collection Time: 06/26/15 11:29 PM  Result Value Ref Range   WBC 10.1 4.0 - 10.5 K/uL   RBC 3.05 (L) 3.87 - 5.11 MIL/uL   Hemoglobin 8.9 (L) 12.0 - 15.0 g/dL   HCT 16.127.0 (L) 09.636.0 - 04.546.0 %   MCV 88.5 78.0 - 100.0 fL   MCH 29.2 26.0 - 34.0 pg   MCHC 33.0 30.0 - 36.0 g/dL   RDW 40.915.0 81.111.5 - 91.415.5 %   Platelets 230 150 - 400 K/uL   Abdomen is soft and non tender  IMPRESSION: POD #2 Doing well Routine care Discharge home tomorrow

## 2015-06-27 NOTE — Discharge Instructions (Signed)
Iron-Rich Diet ° °Iron is a mineral that helps your body to produce hemoglobin. Hemoglobin is a protein in your red blood cells that carries oxygen to your body's tissues. Eating too little iron may cause you to feel weak and tired, and it can increase your risk for infection. Eating enough iron is necessary for your body's metabolism, muscle function, and nervous system. °Iron is naturally found in many foods. It can also be added to foods or fortified in foods. There are two types of dietary iron: °· Heme iron. Heme iron is absorbed by the body more easily than nonheme iron. Heme iron is found in meat, poultry, and fish. °· Nonheme iron. Nonheme iron is found in dietary supplements, iron-fortified grains, beans, and vegetables. °You may need to follow an iron-rich diet if: °· You have been diagnosed with iron deficiency or iron-deficiency anemia. °· You have a condition that prevents you from absorbing dietary iron, such as: °¨ Infection in your intestines. °¨ Celiac disease. This involves long-lasting (chronic) inflammation of your intestines. °· You do not eat enough iron. °· You eat a diet that is high in foods that impair iron absorption. °· You have lost a lot of blood. °· You have heavy bleeding during your menstrual cycle. °· You are pregnant. °WHAT IS MY PLAN? °Your health care provider may help you to determine how much iron you need per day based on your condition. Generally, when a person consumes sufficient amounts of iron in the diet, the following iron needs are met: °· Men. °¨ 14-18 years old: 11 mg per day. °¨ 19-50 years old: 8 mg per day. °· Women.   °¨ 14-18 years old: 15 mg per day. °¨ 19-50 years old: 18 mg per day. °¨ Over 50 years old: 8 mg per day. °¨ Pregnant women: 27 mg per day. °¨ Breastfeeding women: 9 mg per day. °WHAT DO I NEED TO KNOW ABOUT AN IRON-RICH DIET? °· Eat fresh fruits and vegetables that are high in vitamin C along with foods that are high in iron. This will help increase  the amount of iron that your body absorbs from food, especially with foods containing nonheme iron. Foods that are high in vitamin C include oranges, peppers, tomatoes, and mango. °· Take iron supplements only as directed by your health care provider. Overdose of iron can be life-threatening. If you were prescribed iron supplements, take them with orange juice or a vitamin C supplement. °· Cook foods in pots and pans that are made from iron.   °· Eat nonheme iron-containing foods alongside foods that are high in heme iron. This helps to improve your iron absorption.   °· Certain foods and drinks contain compounds that impair iron absorption. Avoid eating these foods in the same meal as iron-rich foods or with iron supplements. These include: °¨ Coffee, black tea, and red wine. °¨ Milk, dairy products, and foods that are high in calcium. °¨ Beans, soybeans, and peas. °¨ Whole grains. °· When eating foods that contain both nonheme iron and compounds that impair iron absorption, follow these tips to absorb iron better.   °¨ Soak beans overnight before cooking. °¨ Soak whole grains overnight and drain them before using. °¨ Ferment flours before baking, such as using yeast in bread dough. °WHAT FOODS CAN I EAT? °Grains  °Iron-fortified breakfast cereal. Iron-fortified whole-wheat bread. Enriched rice. Sprouted grains. °Vegetables  °Spinach. Potatoes with skin. Green peas. Broccoli. Red and green bell peppers. Fermented vegetables. °Fruits  °Prunes. Raisins. Oranges. Strawberries. Mango. Grapefruit. °Meats and Other   Protein Sources  °Beef liver. Oysters. Beef. Shrimp. Turkey. Chicken. Tuna. Sardines. Chickpeas. Nuts. Tofu. °Beverages  °Tomato juice. Fresh orange juice. Prune juice. Hibiscus tea. Fortified instant breakfast shakes. °Condiments  °Tahini. Fermented soy sauce.  °Sweets and Desserts  °Black-strap molasses.  °Other  °Wheat germ. °The items listed above may not be a complete list of recommended foods or  beverages. Contact your dietitian for more options.  °WHAT FOODS ARE NOT RECOMMENDED? °Grains  °Whole grains. Bran cereal. Bran flour. Oats. °Vegetables  °Artichokes. Brussels sprouts. Kale. °Fruits  °Blueberries. Raspberries. Strawberries. Figs. °Meats and Other Protein Sources  °Soybeans. Products made from soy protein. °Dairy  °Milk. Cream. Cheese. Yogurt. Cottage cheese. °Beverages  °Coffee. Black tea. Red wine. °Sweets and Desserts  °Cocoa. Chocolate. Ice cream. °Other  °Basil. Oregano. Parsley. °The items listed above may not be a complete list of foods and beverages to avoid. Contact your dietitian for more information.  °  °This information is not intended to replace advice given to you by your health care provider. Make sure you discuss any questions you have with your health care provider. °  °Document Released: 09/11/2004 Document Revised: 02/18/2014 Document Reviewed: 08/25/2013 °Elsevier Interactive Patient Education ©2016 [employer] ° ° ° ° °

## 2015-06-28 MED ORDER — IBUPROFEN 600 MG PO TABS: 600.0000 mg | ORAL_TABLET | Freq: Four times a day (QID) | ORAL | Status: AC

## 2015-06-28 MED ORDER — PRENATAL MULTIVITAMIN CH: 1.0000 | ORAL_TABLET | Freq: Every day | ORAL | Status: AC

## 2015-06-28 MED ORDER — OXYCODONE HCL 5 MG PO TABS: 5.0000 mg | ORAL_TABLET | ORAL | Status: AC | PRN

## 2015-06-28 MED ORDER — FERROUS SULFATE 325 (65 FE) MG PO TABS: 325.0000 mg | ORAL_TABLET | Freq: Every day | ORAL | Status: AC

## 2015-06-28 NOTE — Discharge Summary (Signed)
Obstetric Discharge Summary Reason for Admission: bleeding/ctxtx Prenatal Procedures: none Intrapartum Procedures: cesarean: low cervical, transverse Postpartum Procedures: transfusion 2 U Complications-Operative and Postpartum: none HEMOGLOBIN  Date Value Ref Range Status  06/26/2015 8.9* 12.0 - 15.0 g/dL Final   HCT  Date Value Ref Range Status  06/26/2015 27.0* 36.0 - 46.0 % Final    Physical Exam:  General: alert Lochia: appropriate Uterine Fundus: firm Incision: healing well DVT Evaluation: No evidence of DVT seen on physical exam.  Discharge Diagnoses: Term Pregnancy-delivered, abruption>>>urgent CS  Discharge Information: Date: 06/28/2015 Activity: pelvic rest Diet: routine Medications: PNV, Ibuprofen, Iron and Percocet Condition: stable Instructions: refer to practice specific booklet Discharge to: home Follow-up Information    Follow up with Meriel PicaHOLLAND,Shaili Donalson M, MD. Schedule an appointment as soon as possible for a visit in 1 week.   Specialty:  Obstetrics and Gynecology   Contact information:   [address] [place] [id number] [phone]       Newborn Data: Live born female  Birth Weight: 7 lb 8.3 oz (3410 g) APGAR: 9, 9  Home with mother.  Ashley Wolf 06/28/2015, 11:07 AM

## 2015-06-28 NOTE — Lactation Note (Signed)
This note was copied from a baby's chart. Lactation Consultation Note  Patient Name: Boy Fausto SkillernLeslie Trostel UJWJX'BToday's Date: 06/28/2015 Reason for consult: Follow-up assessment Mom reports nipple tenderness is improving with wearing breast shells and using comfort gels. Mom declined any questions/concerns. Engorgement care reviewed if needed. Offered to assist with latch before d/c today. Baby recently BF and Mom declined. Advised of OP services and support group. Encouraged to call for questions/concerns.   Maternal Data    Feeding Feeding Type: Breast Fed Length of feed: 15 min  LATCH Score/Interventions Latch: Grasps breast easily, tongue down, lips flanged, rhythmical sucking. Intervention(s): Adjust position  Audible Swallowing: Spontaneous and intermittent  Type of Nipple: Everted at rest and after stimulation Intervention(s): Shells  Comfort (Breast/Nipple): Filling, red/small blisters or bruises, mild/mod discomfort  Problem noted: Mild/Moderate discomfort;Cracked, bleeding, blisters, bruises Interventions  (Cracked/bleeding/bruising/blister): Expressed breast milk to nipple;Hand pump Interventions (Mild/moderate discomfort): Comfort gels  Hold (Positioning): No assistance needed to correctly position infant at breast.  LATCH Score: 9  Lactation Tools Discussed/Used Tools: Shells;Comfort gels Shell Type: Inverted   Consult Status Consult Status: Complete Date: 06/28/15 Follow-up type: In-patient    Alfred LevinsGranger, Ivoree Felmlee Ann 06/28/2015, 9:10 AM

## 2015-07-08 ENCOUNTER — Encounter (HOSPITAL_COMMUNITY): Payer: Self-pay | Admitting: Obstetrics and Gynecology

## 2015-07-18 ENCOUNTER — Encounter (HOSPITAL_COMMUNITY): Payer: Self-pay | Admitting: Obstetrics and Gynecology

## 2017-03-09 IMAGING — US US MFM OB LIMITED
1 series · 15 of 17 positions shown · non-contrast
Comparison: none

[Series 1: us mfm ob limited · 15 of 17 slices shown]
[im 1/17]
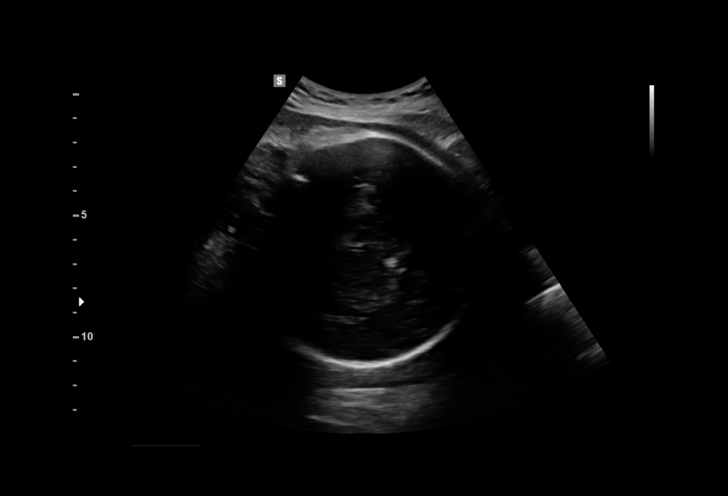
[im 2/17]
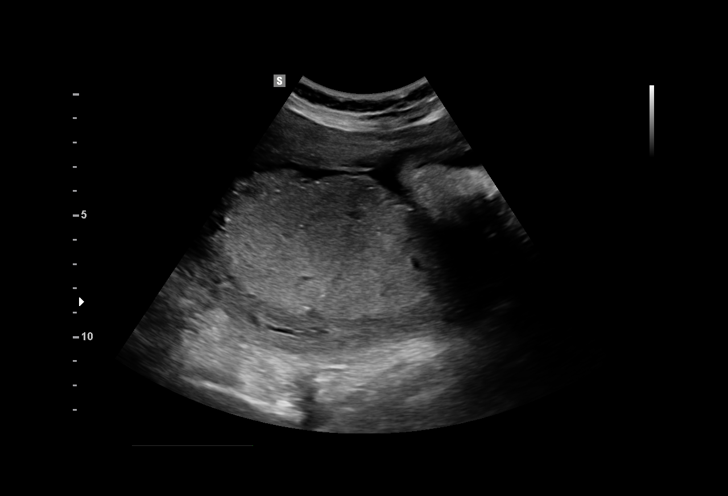
[im 3/17]
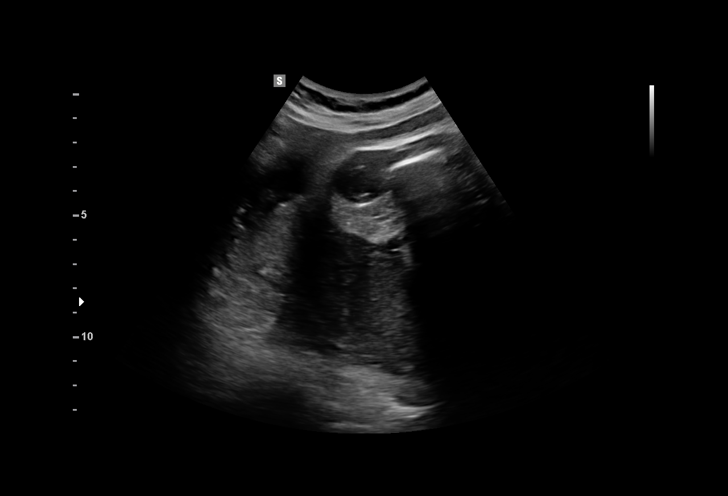
[im 4/17]
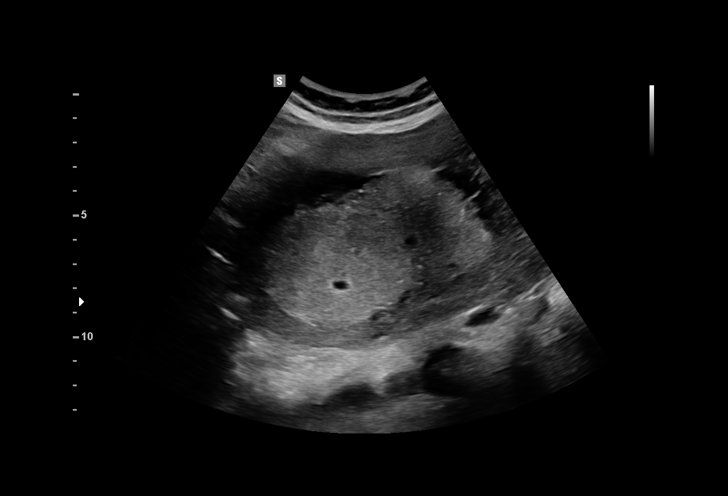
[im 6/17]
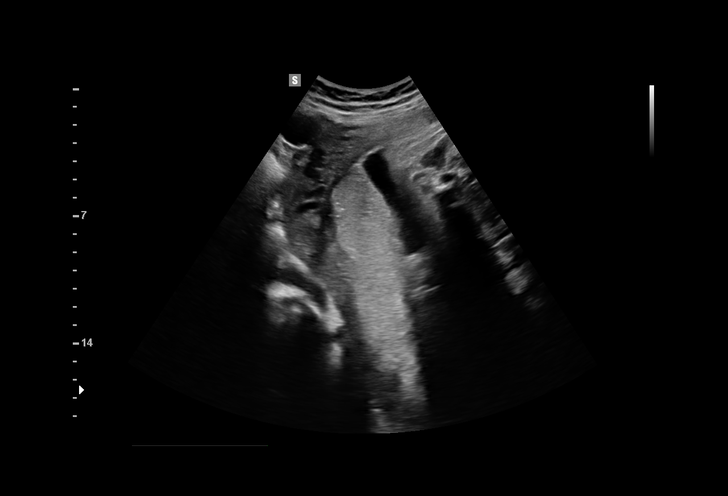
[im 7/17]
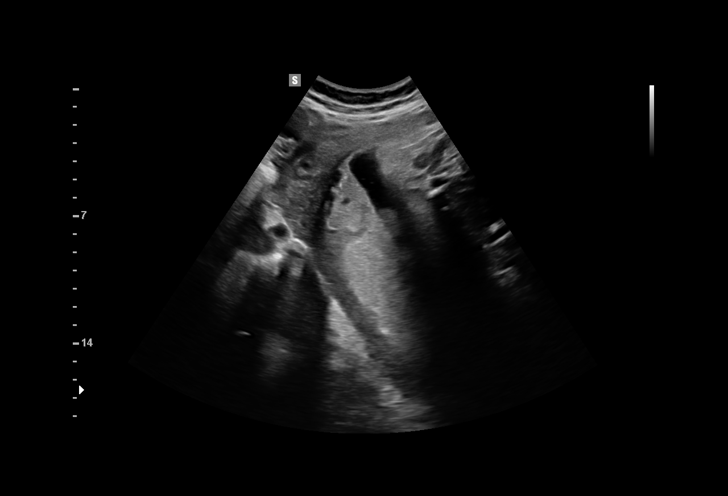
[im 8/17]
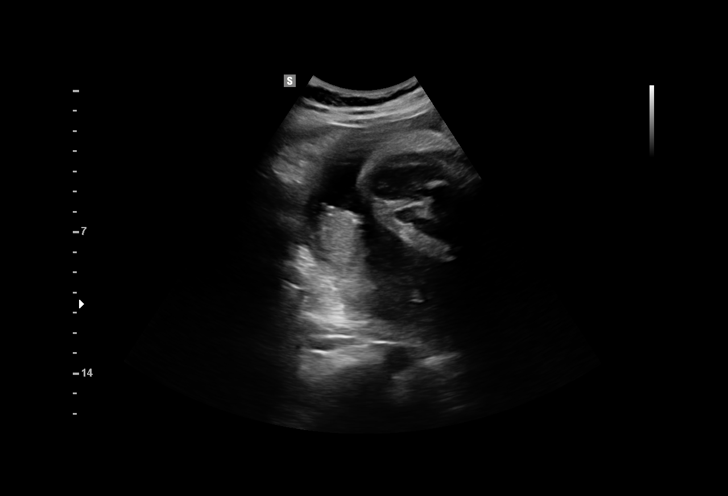
[im 9/17]
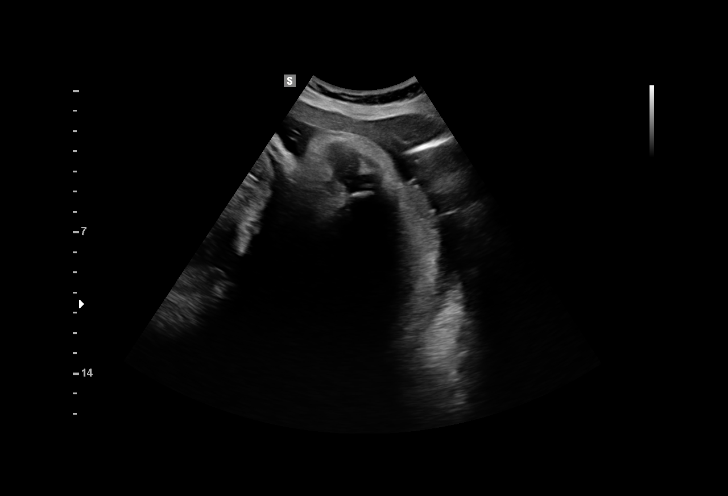
[im 10/17]
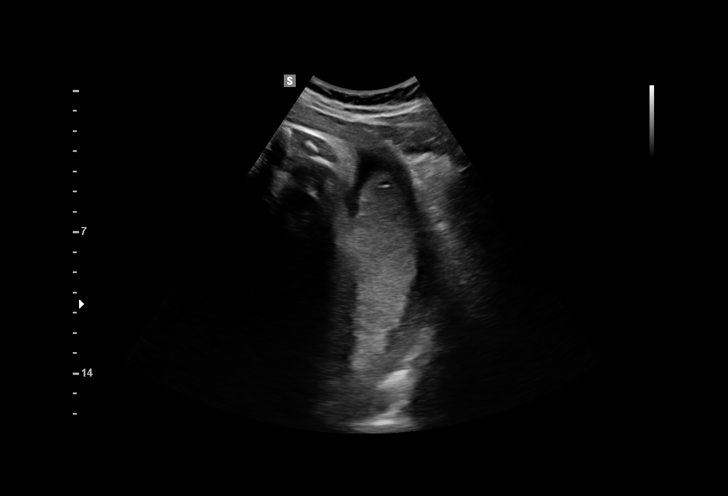
[im 11/17]
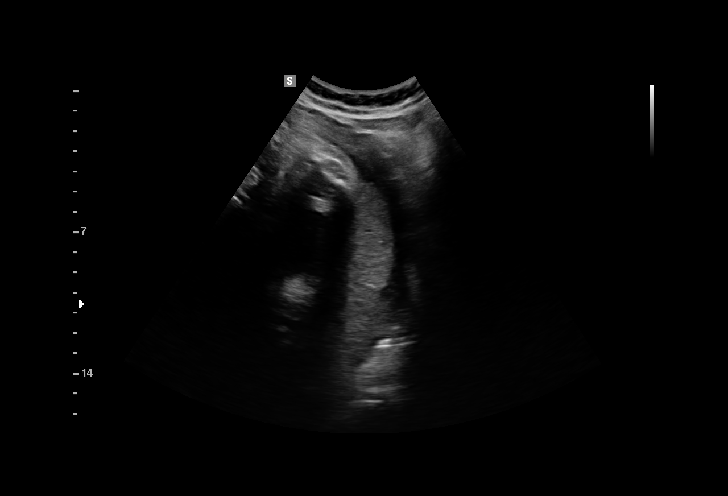
[im 12/17]
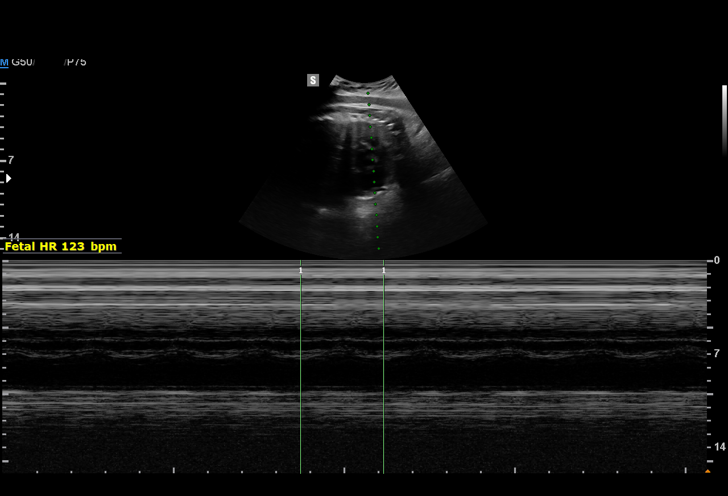
[im 14/17]
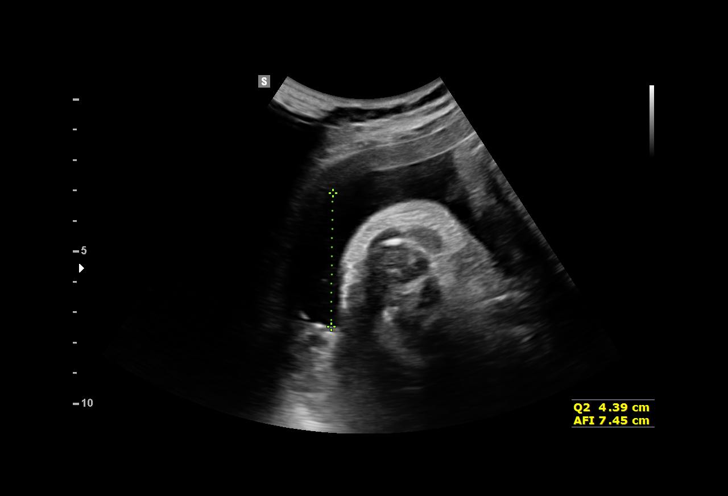
[im 15/17]
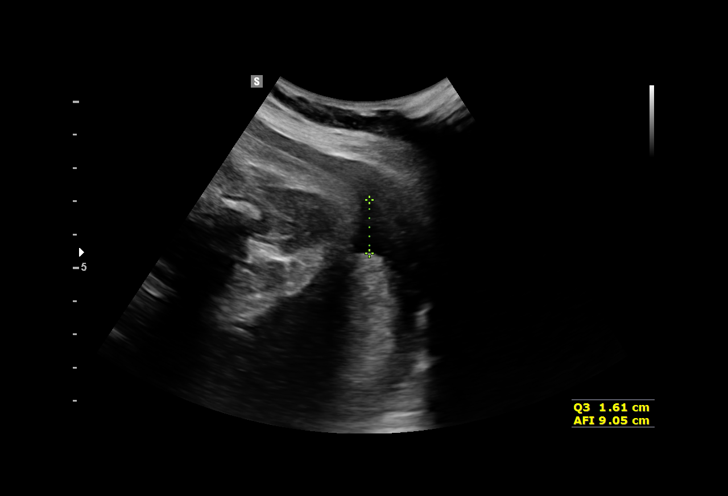
[im 16/17]
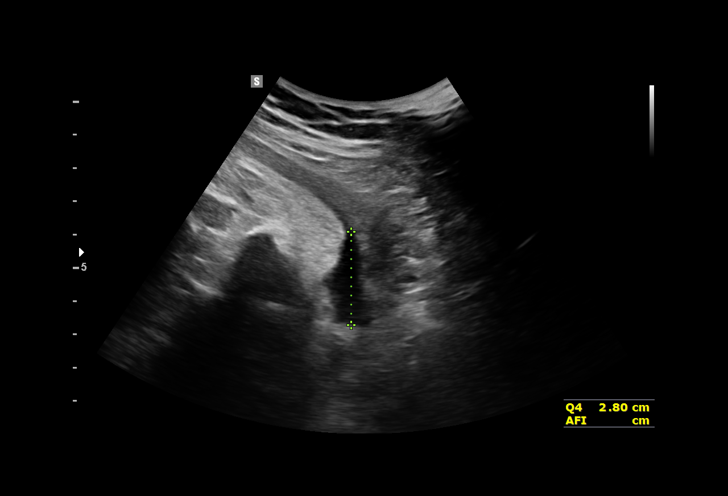
[im 17/17]
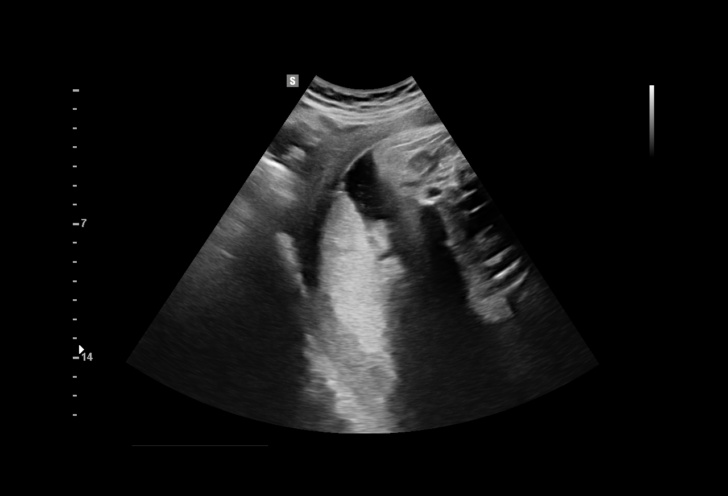

[15 of 17 positions shown; findings below may reference images not displayed]

[REDACTED]
Attending:        Kwun Ho Manki       Secondary Phy.:    [facility]-
MAU/Triage

Indications

39 weeks gestation of pregnancy
Vaginal bleeding in pregnancy, third trimester
OB History

Gravidity:    2         Term:   1
Living:       1
Fetal Evaluation

Num Of Fetuses:     1
Fetal Heart         123
Rate(bpm):
Cardiac Activity:   Observed
Presentation:       Cephalic
Placenta:           Posterior, above cervical os
P. Cord Insertion:  Not well visualized

Amniotic Fluid
AFI FV:      Subjectively within normal limits

AFI Sum(cm)     %Tile       Largest Pocket(cm)
11.86           45

RUQ(cm)       RLQ(cm)       LUQ(cm)        LLQ(cm)
3.06

Comment:    No definite evidence of acute abruption or other abnormality in
visualized portions of placenta.
Gestational Age

Clinical EDD:  39w 4d                                        EDD:   06/28/15
Best:          39w 4d     Det. By:  Clinical EDD             EDD:   06/28/15
Impression

SIUP at 24w3d (remote read only)
active singleton gestation
no previa
AFI is normal
Recommendations

In setting of vaginal bleeding at term, it is reasonable to
consider delivery.  Clinical correlation is recommended as
management per OB provider.
